# Patient Record
Sex: Female | Born: 2000 | Race: Black or African American | Hispanic: No | Marital: Single
Health system: Southern US, Community
[De-identification: ages and names within clinical notes are randomized; demographics above are authoritative.]

---
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
## 2018-07-07 IMAGING — CR DG CHEST 2V
2 series · 2 of 2 positions shown · non-contrast
Comparison: None.

CLINICAL DATA: Chest pain

EXAM:
CHEST - 2 VIEW

[w chest pa]
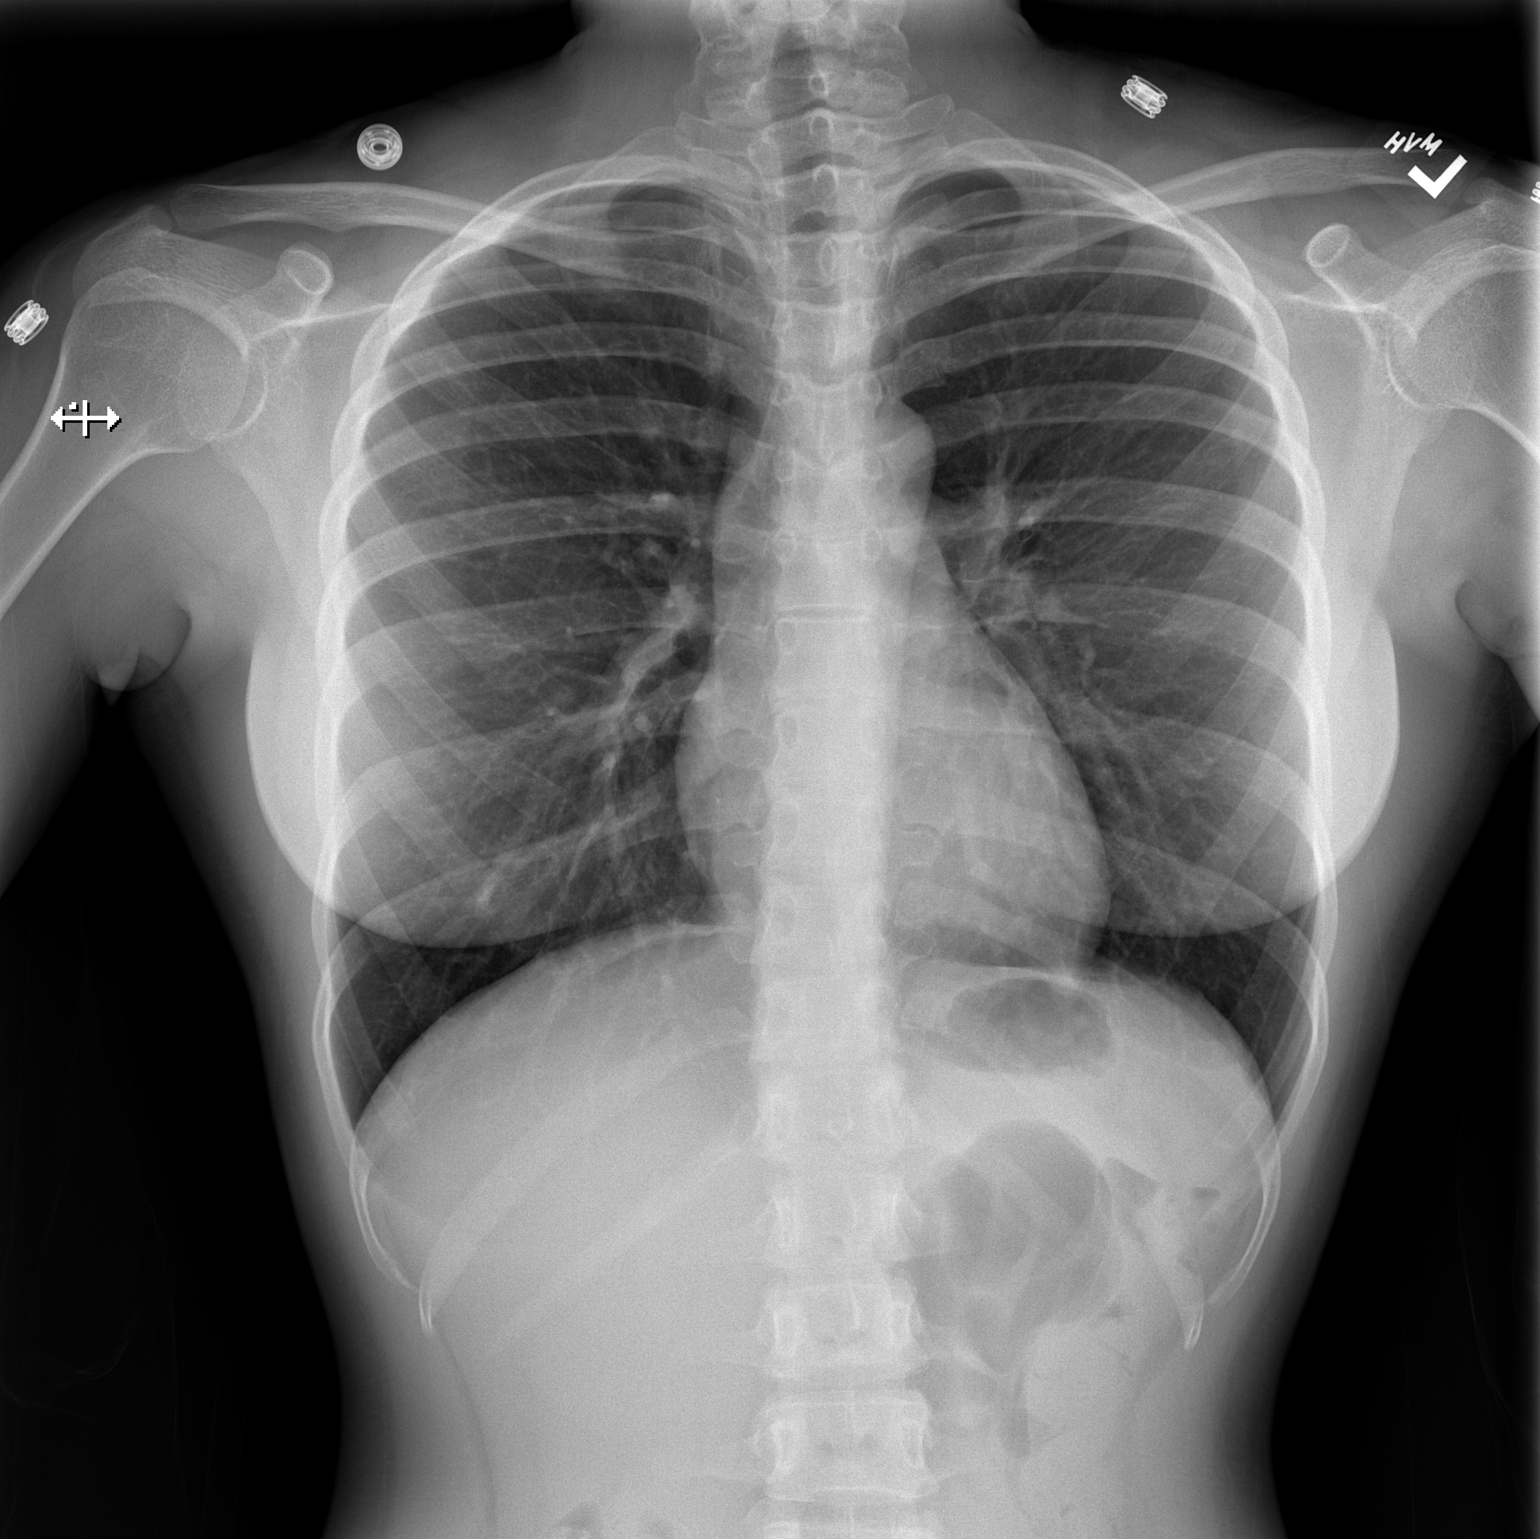

[w chest lat]
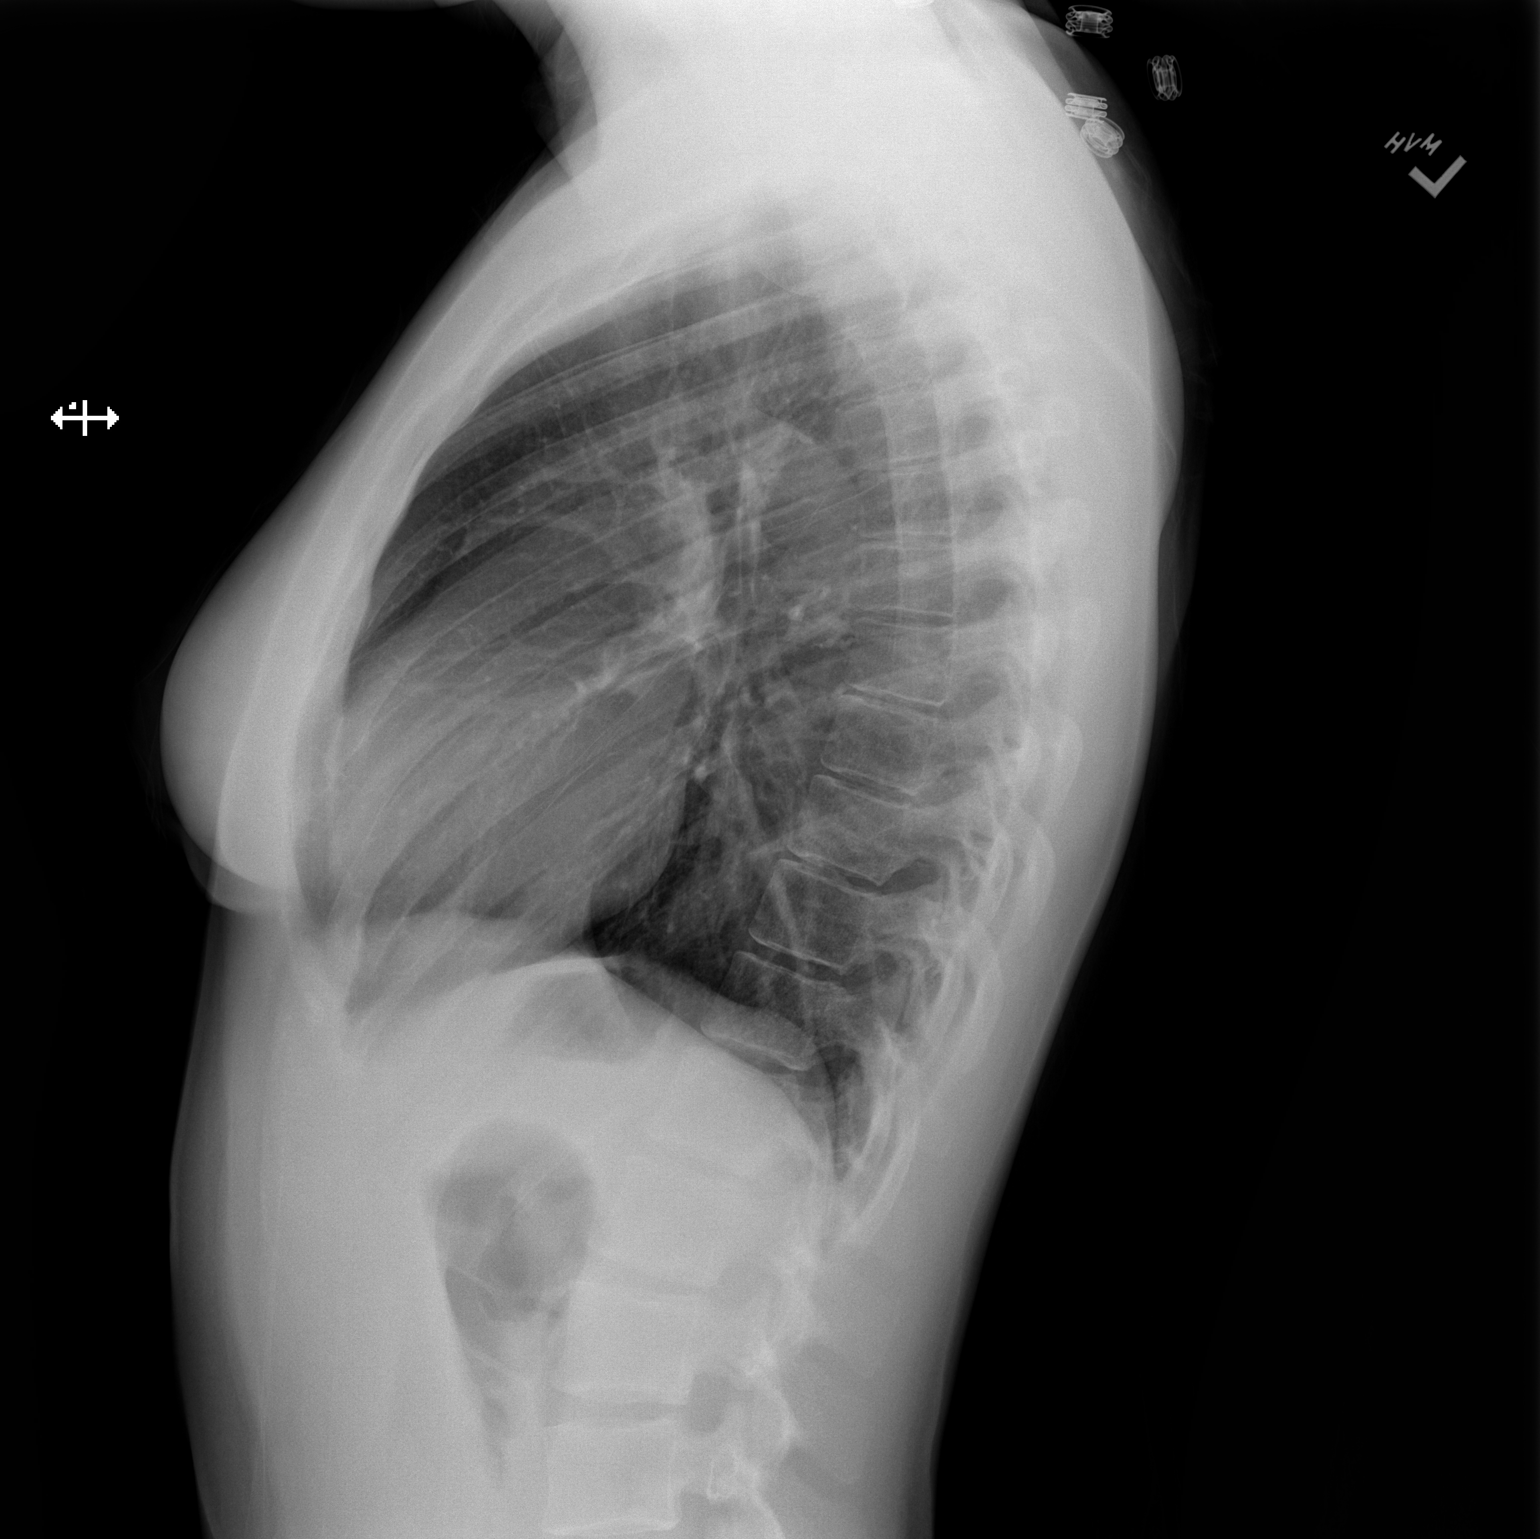

[2 of 2 positions shown; findings below may reference images not displayed]

FINDINGS: The heart size and mediastinal contours are within normal limits.
Both lungs are clear. The visualized skeletal structures are
unremarkable.
IMPRESSION: No active cardiopulmonary disease.
